# Patient Record
Sex: Female | Born: 2000 | Race: White | Hispanic: No | Marital: Married | Smoking: Current every day smoker
Health system: Southern US, Community
[De-identification: ages and names within clinical notes are randomized; demographics above are authoritative.]

## PROBLEM LIST (undated history)

## (undated) DIAGNOSIS — S0285XA Fracture of orbit, unspecified, initial encounter for closed fracture: Secondary | ICD-10-CM

## (undated) DIAGNOSIS — J039 Acute tonsillitis, unspecified: Secondary | ICD-10-CM

## (undated) DIAGNOSIS — F32A Depression, unspecified: Secondary | ICD-10-CM

## (undated) DIAGNOSIS — D513 Other dietary vitamin B12 deficiency anemia: Secondary | ICD-10-CM

## (undated) DIAGNOSIS — R002 Palpitations: Secondary | ICD-10-CM

## (undated) DIAGNOSIS — F419 Anxiety disorder, unspecified: Secondary | ICD-10-CM

## (undated) HISTORY — PX: NO PAST SURGERIES: SHX2092

## (undated) HISTORY — DX: Palpitations: R00.2

## (undated) HISTORY — DX: Depression, unspecified: F32.A

## (undated) HISTORY — DX: Anxiety disorder, unspecified: F41.9

## (undated) HISTORY — DX: Other dietary vitamin B12 deficiency anemia: D51.3

---
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
## 2019-02-25 ENCOUNTER — Emergency Department (HOSPITAL_BASED_OUTPATIENT_CLINIC_OR_DEPARTMENT_OTHER)
Admission: EM | Admit: 2019-02-25 | Discharge: 2019-02-26 | Disposition: A | Discharge status: Home / Self Care | Payer: Worker's Compensation | Attending: Emergency Medicine | Admitting: Emergency Medicine

## 2019-02-25 ENCOUNTER — Other Ambulatory Visit: Payer: Self-pay

## 2019-02-25 ENCOUNTER — Encounter (HOSPITAL_BASED_OUTPATIENT_CLINIC_OR_DEPARTMENT_OTHER): Payer: Self-pay

## 2019-02-25 ENCOUNTER — Emergency Department (HOSPITAL_BASED_OUTPATIENT_CLINIC_OR_DEPARTMENT_OTHER): Payer: Worker's Compensation

## 2019-02-25 DIAGNOSIS — Y939 Activity, unspecified: Secondary | ICD-10-CM | POA: Insufficient documentation

## 2019-02-25 DIAGNOSIS — W19XXXA Unspecified fall, initial encounter: Secondary | ICD-10-CM | POA: Insufficient documentation

## 2019-02-25 DIAGNOSIS — S63502A Unspecified sprain of left wrist, initial encounter: Secondary | ICD-10-CM | POA: Insufficient documentation

## 2019-02-25 DIAGNOSIS — S6992XA Unspecified injury of left wrist, hand and finger(s), initial encounter: Secondary | ICD-10-CM | POA: Diagnosis present

## 2019-02-25 DIAGNOSIS — Y990 Civilian activity done for income or pay: Secondary | ICD-10-CM | POA: Insufficient documentation

## 2019-02-25 DIAGNOSIS — Y9289 Other specified places as the place of occurrence of the external cause: Secondary | ICD-10-CM | POA: Insufficient documentation

## 2019-02-25 NOTE — ED Triage Notes (Signed)
Pt states she fel at work ~745pm-pain to left wrist-no break in skin/bruising or swelling noted-NAD-steady gait

## 2019-02-26 MED ORDER — NAPROXEN 250 MG PO TABS
500.0000 mg | ORAL_TABLET | Freq: Once | ORAL | Status: AC
  Administered 2019-02-26: 500 mg via ORAL
  Filled 2019-02-26: qty 2

## 2019-02-26 MED ORDER — NAPROXEN 375 MG PO TABS: ORAL_TABLET | ORAL | 0 refills | Status: DC

## 2019-02-26 NOTE — ED Provider Notes (Signed)
[facility] Provider Note: Lowella Dell, MD, FACEP  CSN: [id number] MRN: [id number] ARRIVAL: 02/25/19 at 2234 ROOM: MH05/MH05   CHIEF COMPLAINT  Wrist Injury   HISTORY OF PRESENT ILLNESS  02/26/19 12:54 AM Lori Matthews is a 18 y.o. female who fell at work yesterday evening about 7:45 PM.  She fell onto her outstretched left hand and has pain in her left wrist which she rates as a 9 out of 10, worse with movement.  She denies other injury.  There is no associated deformity or swelling.  The pain is located primarily dorsally.   Past Medical History:  Diagnosis Date  . Orbital fracture (HCC)   . Tonsillitis     Past Surgical History:  Procedure Laterality Date  . TONSILLECTOMY N/A 06/20/2018   Procedure: TONSILLECTOMY;  Surgeon: Drema Halon, MD;  Location: [facility];  Service: ENT;  Laterality: N/A;    Family History  Problem Relation Age of Onset  . Diabetes Mother   . Hypertension Mother   . Depression Maternal Grandmother   . Hypertension Maternal Grandmother   . Depression Maternal Grandfather   . Hypertension Maternal Grandfather     Social History   Tobacco Use  . Smoking status: Never Smoker  . Smokeless tobacco: Never Used  Substance Use Topics  . Alcohol use: No  . Drug use: No    Prior to Admission medications   Medication Sig Start Date End Date Taking? Authorizing Provider  aspirin-acetaminophen-caffeine (EXCEDRIN MIGRAINE) [phone] MG tablet Take by mouth every 6 (six) hours as needed for headache.    [provider]  naproxen (NAPROSYN) 375 MG tablet Take 1 tablet twice daily as needed for wrist pain. 02/26/19   Cornelious Bartolucci, MD  norelgestromin-ethinyl estradiol (ORTHO EVRA) 150-35 MCG/24HR transdermal patch Place 1 patch onto the skin once a week.    [provider]    Allergies Patient has no known allergies.   REVIEW OF SYSTEMS  Negative except as noted here or in the History of  Present Illness.   PHYSICAL EXAMINATION  Initial Vital Signs Blood pressure 133/84, pulse 88, temperature 99.2 F (37.3 C), temperature source Oral, resp. rate 16, height 5\' 7"  (1.702 m), weight 77.1 kg, SpO2 98 %.  Examination General: Well-developed, well-nourished female in no acute distress; appearance consistent with age of record HENT: normocephalic; atraumatic Eyes: pupils equal, round and reactive to light; extraocular muscles intact Neck: supple Heart: regular rate and rhythm Lungs: clear to auscultation bilaterally Abdomen: soft; nondistended; nontender; bowel sounds present Extremities: No deformity; full range of motion; pulses normal; mild tenderness of left wrist without snuffbox tenderness, no swelling or ecchymosis Neurologic: Awake, alert and oriented; motor function intact in all extremities and symmetric; no facial droop Skin: Warm and dry Psychiatric: Normal mood and affect   RESULTS  Summary of this visit's results, reviewed by myself:   EKG Interpretation  Date/Time:    Ventricular Rate:    PR Interval:    QRS Duration:   QT Interval:    QTC Calculation:   R Axis:     Text Interpretation:        Laboratory Studies: No results found for this or any previous visit (from the past 24 hour(s)). Imaging Studies: Dg Wrist Complete Left  Result Date: 02/25/2019 CLINICAL DATA:  18 year old female status post fall at work. EXAM: LEFT WRIST - COMPLETE 3+ VIEW COMPARISON:  Left forearm series 05/18/2012. FINDINGS: The patient is now skeletally immature. Bone mineralization  is within normal limits. There is no evidence of fracture or dislocation. There is no evidence of arthropathy or other focal bone abnormality. Soft tissues are unremarkable. IMPRESSION: Negative. Electronically Signed   By: Genevie Ann M.D.   On: 02/25/2019 23:21    ED COURSE and MDM  Nursing notes and initial vitals signs, including pulse oximetry, reviewed.  Vitals:   02/25/19 2243  BP:  133/84  Pulse: 88  Resp: 16  Temp: 99.2 F (37.3 C)  TempSrc: Oral  SpO2: 98%  Weight: 77.1 kg  Height: 5\' 7"  (1.702 m)   Examination and radiographs consistent with sprain of left wrist.  Will place in a Velcro splint and refer to sports medicine if symptoms persist.  PROCEDURES    ED DIAGNOSES     ICD-10-CM   1. Fall, initial encounter  W19.XXXA   2. Sprain of left wrist, initial encounter  Y30.160F        Shanon Rosser, MD 02/26/19 0105

## 2020-05-02 ENCOUNTER — Telehealth: Payer: Self-pay | Admitting: Physician Assistant

## 2020-05-02 ENCOUNTER — Telehealth: Payer: Self-pay | Admitting: Unknown Physician Specialty

## 2020-05-02 ENCOUNTER — Other Ambulatory Visit: Payer: Self-pay | Admitting: Unknown Physician Specialty

## 2020-05-02 DIAGNOSIS — U071 COVID-19: Secondary | ICD-10-CM

## 2020-05-02 DIAGNOSIS — E663 Overweight: Secondary | ICD-10-CM

## 2020-05-02 DIAGNOSIS — F17200 Nicotine dependence, unspecified, uncomplicated: Secondary | ICD-10-CM

## 2020-05-02 NOTE — Telephone Encounter (Signed)
Called to discuss with Antonietta Breach about Covid symptoms and the use of  monoclonal antibody infusion for those with mild to moderate Covid symptoms and at a high risk of hospitalization.     Pt is not qualified for this infusion due to lack of identified risk factors and co-morbid conditions.  Symptoms reviewed as well as criteria for ending isolation.  Symptoms reviewed that would warrant ED/Hospital evaluation as well should her condition worsen. Preventative practices reviewed. Patient verbalized understanding.  Manson Passey, PA-C

## 2020-05-02 NOTE — Telephone Encounter (Signed)
I connected by phone with Lori Matthews on 05/02/2020 at 3:46 PM to discuss the potential use of a new treatment for mild to moderate COVID-19 viral infection in non-hospitalized patients.  This patient is a 19 y.o. female that meets the FDA criteria for Emergency Use Authorization of COVID monoclonal antibody casirivimab/imdevimab, bamlanivimab/etesevimab, or sotrovimab.  Has a (+) direct SARS-CoV-2 viral test result  Has mild or moderate COVID-19   Is NOT hospitalized due to COVID-19  Is within 10 days of symptom onset  Has at least one of the high risk factor(s) for progression to severe COVID-19 and/or hospitalization as defined in EUA.  Specific high risk criteria : BMI > 25 smoker   I have spoken and communicated the following to the patient or parent/caregiver regarding COVID monoclonal antibody treatment:  1. FDA has authorized the emergency use for the treatment of mild to moderate COVID-19 in adults and pediatric patients with positive results of direct SARS-CoV-2 viral testing who are 64 years of age and older weighing at least 40 kg, and who are at high risk for progressing to severe COVID-19 and/or hospitalization.  2. The significant known and potential risks and benefits of COVID monoclonal antibody, and the extent to which such potential risks and benefits are unknown.  3. Information on available alternative treatments and the risks and benefits of those alternatives, including clinical trials.  4. Patients treated with COVID monoclonal antibody should continue to self-isolate and use infection control measures (e.g., wear mask, isolate, social distance, avoid sharing personal items, clean and disinfect "high touch" surfaces, and frequent handwashing) according to CDC guidelines.   5. The patient or parent/caregiver has the option to accept or refuse COVID monoclonal antibody treatment.  After reviewing this information with the patient, the patient has agreed to  receive one of the available covid 19 monoclonal antibodies and will be provided an appropriate fact sheet prior to infusion. Gabriel Cirri, NP 05/02/2020 3:46 PM  Sx onset 12/18

## 2020-05-03 ENCOUNTER — Ambulatory Visit (HOSPITAL_COMMUNITY): Payer: BLUE CROSS/BLUE SHIELD

## 2020-08-23 ENCOUNTER — Telehealth: Payer: Self-pay

## 2020-08-23 NOTE — Telephone Encounter (Signed)
Referral notes sent from [facility], Phone #: [phone], Fax #: [phone]   Notes sent to scheduling

## 2020-09-09 ENCOUNTER — Other Ambulatory Visit: Payer: Self-pay

## 2020-09-09 DIAGNOSIS — S0285XA Fracture of orbit, unspecified, initial encounter for closed fracture: Secondary | ICD-10-CM | POA: Insufficient documentation

## 2020-09-09 DIAGNOSIS — J039 Acute tonsillitis, unspecified: Secondary | ICD-10-CM | POA: Insufficient documentation

## 2020-09-09 DIAGNOSIS — F32A Depression, unspecified: Secondary | ICD-10-CM | POA: Insufficient documentation

## 2020-09-09 DIAGNOSIS — F419 Anxiety disorder, unspecified: Secondary | ICD-10-CM | POA: Insufficient documentation

## 2020-09-09 DIAGNOSIS — R002 Palpitations: Secondary | ICD-10-CM | POA: Insufficient documentation

## 2020-09-09 DIAGNOSIS — D513 Other dietary vitamin B12 deficiency anemia: Secondary | ICD-10-CM | POA: Insufficient documentation

## 2020-09-16 ENCOUNTER — Encounter: Payer: Self-pay | Admitting: Cardiology

## 2020-09-16 ENCOUNTER — Telehealth (INDEPENDENT_AMBULATORY_CARE_PROVIDER_SITE_OTHER): Payer: BLUE CROSS/BLUE SHIELD | Admitting: Cardiology

## 2020-09-16 VITALS — BP 110/67 | HR 72 | Ht 67.0 in | Wt 161.0 lb

## 2020-09-16 DIAGNOSIS — F419 Anxiety disorder, unspecified: Secondary | ICD-10-CM

## 2020-09-16 DIAGNOSIS — R002 Palpitations: Secondary | ICD-10-CM
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
## 2020-09-16 NOTE — Patient Instructions (Signed)
Medication Instructions:  Your physician recommends that you continue on your current medications as directed. Please refer to the Current Medication list given to you today.  *If you need a refill on your cardiac medications before your next appointment, please call your pharmacy*   Lab Work: None If you have labs (blood work) drawn today and your tests are completely normal, you will receive your results only by: Marland Kitchen MyChart Message (if you have MyChart) OR . A paper copy in the mail If you have any lab test that is abnormal or we need to change your treatment, we will call you to review the results.   Testing/Procedures: Your physician has requested that you have an echocardiogram. Echocardiography is a painless test that uses sound waves to create images of your heart. It provides your doctor with information about the size and shape of your heart and how well your heart's chambers and valves are working. This procedure takes approximately one hour. There are no restrictions for this procedure.     Follow-Up: At [facility], you and your health needs are our priority.  As part of our continuing mission to provide you with exceptional heart care, we have created designated Provider Care Teams.  These Care Teams include your primary Cardiologist (physician) and Advanced Practice Providers (APPs -  Physician Assistants and Nurse Practitioners) who all work together to provide you with the care you need, when you need it.  We recommend signing up for the patient portal called "MyChart".  Sign up information is provided on this After Visit Summary.  MyChart is used to connect with patients for Virtual Visits (Telemedicine).  Patients are able to view lab/test results, encounter notes, upcoming appointments, etc.  Non-urgent messages can be sent to your provider as well.   To learn more about what you can do with MyChart, go to [email or web address].    Your next appointment:   2  month(s)  The format for your next appointment:   In Person  Provider:   Belva Crome, MD   Other Instructions

## 2020-10-14 ENCOUNTER — Ambulatory Visit (INDEPENDENT_AMBULATORY_CARE_PROVIDER_SITE_OTHER): Payer: No Typology Code available for payment source

## 2020-10-14 ENCOUNTER — Other Ambulatory Visit: Payer: Self-pay

## 2020-10-14 DIAGNOSIS — F419 Anxiety disorder, unspecified: Secondary | ICD-10-CM

## 2020-10-14 DIAGNOSIS — R002 Palpitations: Secondary | ICD-10-CM

## 2020-10-14 LAB — ECHOCARDIOGRAM COMPLETE
Area-P 1/2: 3.06 cm2
S' Lateral: 3.6 cm

## 2020-10-14 NOTE — Progress Notes (Signed)
Complete echocardiogram performed.  Jimmy Ciana Simmon RDCS, RVT  

## 2020-10-30 ENCOUNTER — Other Ambulatory Visit: Payer: Self-pay

## 2020-10-30 ENCOUNTER — Ambulatory Visit
Admission: EM | Admit: 2020-10-30 | Discharge: 2020-10-30 | Disposition: A | Discharge status: Home / Self Care | Payer: No Typology Code available for payment source | Attending: Emergency Medicine | Admitting: Emergency Medicine

## 2020-10-30 ENCOUNTER — Encounter: Payer: Self-pay | Admitting: Emergency Medicine

## 2020-10-30 DIAGNOSIS — Z20822 Contact with and (suspected) exposure to covid-19: Secondary | ICD-10-CM | POA: Diagnosis not present

## 2020-10-30 DIAGNOSIS — H66002 Acute suppurative otitis media without spontaneous rupture of ear drum, left ear: Secondary | ICD-10-CM | POA: Insufficient documentation

## 2020-10-30 DIAGNOSIS — Z28310 Unvaccinated for covid-19: Secondary | ICD-10-CM | POA: Insufficient documentation

## 2020-10-30 DIAGNOSIS — R059 Cough, unspecified: Secondary | ICD-10-CM | POA: Insufficient documentation

## 2020-10-30 DIAGNOSIS — H6123 Impacted cerumen, bilateral: Secondary | ICD-10-CM

## 2020-10-30 DIAGNOSIS — F17290 Nicotine dependence, other tobacco product, uncomplicated: Secondary | ICD-10-CM | POA: Insufficient documentation

## 2020-10-30 DIAGNOSIS — J069 Acute upper respiratory infection, unspecified: Secondary | ICD-10-CM

## 2020-10-30 DIAGNOSIS — R197 Diarrhea, unspecified: Secondary | ICD-10-CM | POA: Diagnosis not present

## 2020-10-30 DIAGNOSIS — J029 Acute pharyngitis, unspecified: Secondary | ICD-10-CM | POA: Diagnosis not present

## 2020-10-30 LAB — POCT RAPID STREP A: Streptococcus, Group A Screen (Direct): NEGATIVE

## 2020-10-30 MED ORDER — PROMETHAZINE-DM 6.25-15 MG/5ML PO SYRP: 5.0000 mL | ORAL_SOLUTION | Freq: Four times a day (QID) | ORAL | 0 refills | Status: AC | PRN

## 2020-10-30 MED ORDER — AMOXICILLIN-POT CLAVULANATE 400-57 MG/5ML PO SUSR: 875.0000 mg | Freq: Two times a day (BID) | ORAL | 0 refills | Status: AC

## 2020-10-30 MED ORDER — BENZONATATE 100 MG PO CAPS: 200.0000 mg | ORAL_CAPSULE | Freq: Three times a day (TID) | ORAL | 0 refills | Status: DC

## 2020-10-30 NOTE — Discharge Instructions (Addendum)
Isolate at home pending the results of your COVID test.  If you test positive then you will have to quarantine for 5 days from the start of your symptoms.  After 5 days you can break quarantine if your symptoms have improved and you have not had a fever for 24 hours without taking Tylenol or ibuprofen.  Use over-the-counter Tylenol and ibuprofen as needed for body aches and fever.  Use the Tessalon Perles during the day as needed for cough and the Promethazine DM cough syrup at nighttime as will make you drowsy.  If you develop any increased shortness of breath-especially at rest, you are unable to speak in full sentences, or is a late sign your lips are turning blue you need to go the ER for evaluation.   For the infection take Augmentin twice daily for 10 days.  Take this with food.  You still have wax in your right ear canal.  You can continue to flush out your ear at home by either submerging her head in a bathtub and agitating her head back-and-forth to create a way of action or by turning her ear up to the faucet in the shower.
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
## 2020-10-30 NOTE — ED Triage Notes (Signed)
Patient c/o sore throat, headache and bodyaches that started Saturday morning.  Patient reports dry cough.  Patient denies fevers.

## 2020-10-31 LAB — SARS CORONAVIRUS 2 (TAT 6-24 HRS): SARS Coronavirus 2: NEGATIVE

## 2020-11-02 LAB — CULTURE, GROUP A STREP (THRC)

## 2020-12-02 ENCOUNTER — Other Ambulatory Visit: Payer: Self-pay

## 2020-12-02 ENCOUNTER — Ambulatory Visit (INDEPENDENT_AMBULATORY_CARE_PROVIDER_SITE_OTHER): Payer: No Typology Code available for payment source | Admitting: Cardiology

## 2020-12-02 ENCOUNTER — Encounter: Payer: Self-pay | Admitting: Cardiology

## 2020-12-02 VITALS — BP 98/56 | HR 56 | Ht 67.5 in | Wt 176.0 lb

## 2020-12-02 DIAGNOSIS — Z8616 Personal history of COVID-19: Secondary | ICD-10-CM

## 2020-12-02 DIAGNOSIS — R0600 Dyspnea, unspecified: Secondary | ICD-10-CM

## 2020-12-02 DIAGNOSIS — R0609 Other forms of dyspnea: Secondary | ICD-10-CM | POA: Insufficient documentation

## 2020-12-02 DIAGNOSIS — R002 Palpitations: Secondary | ICD-10-CM | POA: Diagnosis not present
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
## 2020-12-02 NOTE — Patient Instructions (Addendum)
Medication Instructions:  Your physician recommends that you continue on your current medications as directed. Please refer to the Current Medication list given to you today.  *If you need a refill on your cardiac medications before your next appointment, please call your pharmacy*   Lab Work: None  If you have labs (blood work) drawn today and your tests are completely normal, you will receive your results only by: MyChart Message (if you have MyChart) OR A paper copy in the mail If you have any lab test that is abnormal or we need to change your treatment, we will call you to review the results.   Testing/Procedures: None    Follow-Up: At [facility], you and your health needs are our priority.  As part of our continuing mission to provide you with exceptional heart care, we have created designated Provider Care Teams.  These Care Teams include your primary Cardiologist (physician) and Advanced Practice Providers (APPs -  Physician Assistants and Nurse Practitioners) who all work together to provide you with the care you need, when you need it.  We recommend signing up for the patient portal called "MyChart".  Sign up information is provided on this After Visit Summary.  MyChart is used to connect with patients for Virtual Visits (Telemedicine).  Patients are able to view lab/test results, encounter notes, upcoming appointments, etc.  Non-urgent messages can be sent to your provider as well.   To learn more about what you can do with MyChart, go to [email or web address].    Your next appointment:   As needed     The format for your next appointment:   In Person  Provider:   Robert Krasowski, MD   Other Instructions   

## 2020-12-15 ENCOUNTER — Ambulatory Visit: Payer: No Typology Code available for payment source

## 2020-12-16 ENCOUNTER — Encounter: Payer: Self-pay | Admitting: Emergency Medicine

## 2020-12-16 ENCOUNTER — Emergency Department: Payer: No Typology Code available for payment source

## 2020-12-16 ENCOUNTER — Other Ambulatory Visit: Payer: Self-pay

## 2020-12-16 ENCOUNTER — Emergency Department
Admission: EM | Admit: 2020-12-16 | Discharge: 2020-12-16 | Disposition: A | Discharge status: Home / Self Care | Payer: No Typology Code available for payment source | Attending: Emergency Medicine | Admitting: Emergency Medicine

## 2020-12-16 DIAGNOSIS — W228XXA Striking against or struck by other objects, initial encounter: Secondary | ICD-10-CM | POA: Diagnosis not present

## 2020-12-16 DIAGNOSIS — F17290 Nicotine dependence, other tobacco product, uncomplicated: Secondary | ICD-10-CM | POA: Insufficient documentation

## 2020-12-16 DIAGNOSIS — Z8616 Personal history of COVID-19: Secondary | ICD-10-CM | POA: Insufficient documentation

## 2020-12-16 DIAGNOSIS — S60221A Contusion of right hand, initial encounter: Secondary | ICD-10-CM | POA: Insufficient documentation

## 2020-12-16 DIAGNOSIS — S4991XA Unspecified injury of right shoulder and upper arm, initial encounter: Secondary | ICD-10-CM | POA: Diagnosis present

## 2020-12-16 MED ORDER — NAPROXEN 500 MG PO TABS
500.0000 mg | ORAL_TABLET | Freq: Once | ORAL | Status: AC
  Administered 2020-12-16: 500 mg via ORAL
  Filled 2020-12-16: qty 1

## 2020-12-16 MED ORDER — NAPROXEN 500 MG PO TABS: 500.0000 mg | ORAL_TABLET | Freq: Two times a day (BID) | ORAL | 0 refills | Status: DC

## 2020-12-16 MED ORDER — NAPROXEN 500 MG PO TABS: 500.0000 mg | ORAL_TABLET | Freq: Two times a day (BID) | ORAL | 0 refills | Status: AC

## 2020-12-16 NOTE — ED Triage Notes (Signed)
Pt reports her dog ran into her full speed and hurt her right hand. Pt reports painful to move fingers and base of thumb is the most painful.

## 2020-12-16 NOTE — ED Notes (Signed)
Ace wrap applied to right hand per patient's request and with PA-C's approval.
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
## 2020-12-16 NOTE — Discharge Instructions (Addendum)
Please rest ice and elevate the right hand.  Work on gentle finger range of motion.  Take naproxen 500 mg twice daily for 1 week as needed for pain.  Call orthopedic office in 1 week if no improvement.

## 2021-04-25 ENCOUNTER — Emergency Department: Payer: No Typology Code available for payment source

## 2021-04-25 ENCOUNTER — Emergency Department
Admission: EM | Admit: 2021-04-25 | Discharge: 2021-04-26 | Disposition: A | Discharge status: Home / Self Care | Payer: No Typology Code available for payment source | Attending: Emergency Medicine | Admitting: Emergency Medicine

## 2021-04-25 ENCOUNTER — Other Ambulatory Visit: Payer: Self-pay

## 2021-04-25 ENCOUNTER — Encounter: Payer: Self-pay | Admitting: Emergency Medicine

## 2021-04-25 DIAGNOSIS — Z8616 Personal history of COVID-19: Secondary | ICD-10-CM | POA: Diagnosis not present

## 2021-04-25 DIAGNOSIS — B349 Viral infection, unspecified: Secondary | ICD-10-CM | POA: Diagnosis not present

## 2021-04-25 DIAGNOSIS — F17290 Nicotine dependence, other tobacco product, uncomplicated: Secondary | ICD-10-CM | POA: Diagnosis not present

## 2021-04-25 DIAGNOSIS — Z20822 Contact with and (suspected) exposure to covid-19: Secondary | ICD-10-CM | POA: Insufficient documentation

## 2021-04-25 DIAGNOSIS — R509 Fever, unspecified: Secondary | ICD-10-CM | POA: Diagnosis present

## 2021-04-25 LAB — CBC
HCT: 37.2 % (ref 36.0–46.0)
Hemoglobin: 12 g/dL (ref 12.0–15.0)
MCH: 28.7 pg (ref 26.0–34.0)
MCHC: 32.3 g/dL (ref 30.0–36.0)
MCV: 89 fL (ref 80.0–100.0)
Platelets: 229 10*3/uL (ref 150–400)
RBC: 4.18 MIL/uL (ref 3.87–5.11)
RDW: 13.2 % (ref 11.5–15.5)
WBC: 6.2 10*3/uL (ref 4.0–10.5)
nRBC: 0 % (ref 0.0–0.2)

## 2021-04-25 LAB — BASIC METABOLIC PANEL
Anion gap: 8 (ref 5–15)
BUN: 14 mg/dL (ref 6–20)
CO2: 23 mmol/L (ref 22–32)
Calcium: 8.9 mg/dL (ref 8.9–10.3)
Chloride: 106 mmol/L (ref 98–111)
Creatinine, Ser: 0.87 mg/dL (ref 0.44–1.00)
GFR, Estimated: >60 mL/min (ref >60)
Glucose, Bld: 98 mg/dL (ref 70–99)
Potassium: 3.5 mmol/L (ref 3.5–5.1)
Sodium: 137 mmol/L (ref 135–145)

## 2021-04-25 LAB — RESP PANEL BY RT-PCR (FLU A&B, COVID) ARPGX2
Influenza A by PCR: NEGATIVE
Influenza B by PCR: NEGATIVE
SARS Coronavirus 2 by RT PCR: NEGATIVE

## 2021-04-25 LAB — TROPONIN I (HIGH SENSITIVITY): Troponin I (High Sensitivity): <2 ng/L (ref <18)

## 2021-04-25 MED ORDER — PREDNISONE 50 MG PO TABS: 50.0000 mg | ORAL_TABLET | Freq: Every day | ORAL | 0 refills | Status: AC

## 2021-04-25 MED ORDER — ALBUTEROL SULFATE HFA 108 (90 BASE) MCG/ACT IN AERS: 2.0000 | INHALATION_SPRAY | RESPIRATORY_TRACT | 0 refills | Status: AC | PRN

## 2021-04-25 MED ORDER — FLUTICASONE PROPIONATE 50 MCG/ACT NA SUSP: 1.0000 | Freq: Two times a day (BID) | NASAL | 0 refills | Status: AC

## 2021-04-25 MED ORDER — MAGIC MOUTHWASH W/LIDOCAINE: 5.0000 mL | Freq: Four times a day (QID) | ORAL | 0 refills | Status: DC

## 2021-04-25 MED ORDER — BENZONATATE 100 MG PO CAPS: 100.0000 mg | ORAL_CAPSULE | Freq: Three times a day (TID) | ORAL | 0 refills | Status: DC | PRN

## 2021-04-25 MED ORDER — ACETAMINOPHEN 325 MG PO TABS
650.0000 mg | ORAL_TABLET | Freq: Once | ORAL | Status: AC
  Administered 2021-04-25: 650 mg via ORAL
  Filled 2021-04-25: qty 2
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
## 2021-04-25 NOTE — ED Triage Notes (Signed)
Pt reports that she has had a sore throat, cough, bodyaches since Thursday, but the last few days she has had SHOB and CP. She took a COVID test at home yesterday and it was negative.

## 2021-08-07 ENCOUNTER — Ambulatory Visit (LOCAL_COMMUNITY_HEALTH_CENTER): Payer: Self-pay

## 2021-08-07 ENCOUNTER — Other Ambulatory Visit: Payer: Self-pay

## 2021-08-07 DIAGNOSIS — Z0184 Encounter for antibody response examination: Secondary | ICD-10-CM

## 2021-08-07 NOTE — Progress Notes (Signed)
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 Dental office paid for titer today. Per [facility] review, pt has had total of 5 Hep B vaccines. Per pt, she had low Hep B titer 2021 and received 2 doses of Hep B vaccine.  ? ?Pt plans to view Hep B titer results on MyChart and is aware that [facility] RN will call with results and a copy can be made available for pick up at [facility] if needed. ? ?ROI signed and RN walked pt to lab. Jerel Shepherd, RN ? ?

## 2021-08-08 LAB — HEPATITIS B SURFACE ANTIBODY, QUANTITATIVE: Hepatitis B Surf Ab Quant: >1000 m[IU]/mL (ref >9.9)

## 2021-08-09 ENCOUNTER — Telehealth: Payer: Self-pay | Admitting: Family Medicine

## 2021-08-09 NOTE — Telephone Encounter (Signed)
Pt states that she missed a phone call and believes the call may have been about her titer results. Please call.  ?

## 2021-08-10 NOTE — Telephone Encounter (Signed)
Return call to patient regarding her HEP B Titer results.  Patient is Immune.  Left message to return my call at [phone] or check her MyChart message. Dahlia Bailiff, RN ? ?

## 2021-08-10 NOTE — Telephone Encounter (Signed)
Patient has viewed her Hep B Titer results via MyChart message sent this morning.  Last patient login 08-10-2021. Hart Carwin, RN ? ?

## 2021-12-13 ENCOUNTER — Other Ambulatory Visit: Payer: Self-pay

## 2021-12-13 ENCOUNTER — Emergency Department
Admission: EM | Admit: 2021-12-13 | Discharge: 2021-12-13 | Disposition: A | Discharge status: Home / Self Care | Payer: No Typology Code available for payment source | Attending: Emergency Medicine | Admitting: Emergency Medicine

## 2021-12-13 ENCOUNTER — Emergency Department: Payer: No Typology Code available for payment source

## 2021-12-13 DIAGNOSIS — Y92410 Unspecified street and highway as the place of occurrence of the external cause: Secondary | ICD-10-CM | POA: Insufficient documentation

## 2021-12-13 DIAGNOSIS — S060X0A Concussion without loss of consciousness, initial encounter: Secondary | ICD-10-CM

## 2021-12-13 DIAGNOSIS — M25562 Pain in left knee: Secondary | ICD-10-CM

## 2021-12-13 DIAGNOSIS — S0990XA Unspecified injury of head, initial encounter: Secondary | ICD-10-CM | POA: Diagnosis present

## 2021-12-13 DIAGNOSIS — S161XXA Strain of muscle, fascia and tendon at neck level, initial encounter: Secondary | ICD-10-CM

## 2021-12-13 MED ORDER — BACLOFEN 10 MG PO TABS: 10.0000 mg | ORAL_TABLET | Freq: Three times a day (TID) | ORAL | 0 refills | Status: AC
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
## 2021-12-13 NOTE — Discharge Instructions (Signed)
Follow-up with your regular doctor if not improving in 3 days.  Return if worsening. ?
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
## 2022-03-27 ENCOUNTER — Ambulatory Visit (INDEPENDENT_AMBULATORY_CARE_PROVIDER_SITE_OTHER): Payer: No Typology Code available for payment source

## 2022-03-27 ENCOUNTER — Ambulatory Visit
Admission: RE | Admit: 2022-03-27 | Discharge: 2022-03-27 | Disposition: A | Discharge status: Home / Self Care | Payer: No Typology Code available for payment source | Source: Ambulatory Visit | Attending: Emergency Medicine | Admitting: Emergency Medicine

## 2022-03-27 VITALS — BP 113/71 | HR 82 | Temp 97.9°F | Resp 18 | Ht 67.0 in | Wt 180.0 lb

## 2022-03-27 DIAGNOSIS — S8002XA Contusion of left knee, initial encounter: Secondary | ICD-10-CM | POA: Diagnosis not present

## 2022-03-27 DIAGNOSIS — M25562 Pain in left knee: Secondary | ICD-10-CM

## 2022-03-27 DIAGNOSIS — W19XXXA Unspecified fall, initial encounter: Secondary | ICD-10-CM | POA: Diagnosis not present

## 2022-03-27 DIAGNOSIS — S8992XA Unspecified injury of left lower leg, initial encounter: Secondary | ICD-10-CM

## 2022-03-27 DIAGNOSIS — S8990XA Unspecified injury of unspecified lower leg, initial encounter: Secondary | ICD-10-CM | POA: Insufficient documentation

## 2022-03-27 DIAGNOSIS — M25462 Effusion, left knee: Secondary | ICD-10-CM

## 2022-03-27 NOTE — Discharge Instructions (Addendum)
Take Tylenol or ibuprofen as directed.  Rest and elevate your knee.  Apply ice packs 2-3 times a day for up to 20 minutes each.  Wear the knee sleeve.  Follow up with an orthopedist such as the one listed below.
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
## 2022-03-27 NOTE — ED Triage Notes (Signed)
Patient to Urgent Care with complaints of left sided knee pain. Reports falling (reports slipping and falling on hardwood floor) on Friday 11/10 and is having continued discomfort. 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 Denies hx of injury.

## 2022-04-03 ENCOUNTER — Other Ambulatory Visit: Payer: Self-pay | Admitting: Orthopedic Surgery

## 2022-04-03 DIAGNOSIS — S83412A Sprain of medial collateral ligament of left knee, initial encounter: Secondary | ICD-10-CM

## 2022-04-10 ENCOUNTER — Ambulatory Visit
Admission: RE | Admit: 2022-04-10 | Discharge: 2022-04-10 | Disposition: A | Discharge status: Home / Self Care | Payer: No Typology Code available for payment source | Source: Ambulatory Visit | Attending: Orthopedic Surgery | Admitting: Orthopedic Surgery

## 2022-04-10 DIAGNOSIS — S83412A Sprain of medial collateral ligament of left knee, initial encounter: Secondary | ICD-10-CM | POA: Insufficient documentation

## 2022-04-18 ENCOUNTER — Emergency Department: Payer: No Typology Code available for payment source

## 2022-04-18 ENCOUNTER — Other Ambulatory Visit: Payer: Self-pay

## 2022-04-18 ENCOUNTER — Emergency Department
Admission: EM | Admit: 2022-04-18 | Discharge: 2022-04-18 | Disposition: A | Discharge status: Home / Self Care | Payer: No Typology Code available for payment source | Attending: Emergency Medicine | Admitting: Emergency Medicine

## 2022-04-18 DIAGNOSIS — K5900 Constipation, unspecified: Secondary | ICD-10-CM | POA: Diagnosis not present

## 2022-04-18 DIAGNOSIS — Z1152 Encounter for screening for COVID-19: Secondary | ICD-10-CM | POA: Diagnosis not present

## 2022-04-18 DIAGNOSIS — D72819 Decreased white blood cell count, unspecified: Secondary | ICD-10-CM | POA: Insufficient documentation

## 2022-04-18 DIAGNOSIS — R1084 Generalized abdominal pain: Secondary | ICD-10-CM | POA: Diagnosis present

## 2022-04-18 DIAGNOSIS — R109 Unspecified abdominal pain: Secondary | ICD-10-CM

## 2022-04-18 LAB — LIPASE, BLOOD: Lipase: 40 U/L (ref 11–51)

## 2022-04-18 LAB — URINALYSIS, ROUTINE W REFLEX MICROSCOPIC
Bacteria, UA: NONE SEEN
Bilirubin Urine: NEGATIVE
Glucose, UA: NEGATIVE mg/dL
Ketones, ur: NEGATIVE mg/dL
Leukocytes,Ua: NEGATIVE
Nitrite: NEGATIVE
Protein, ur: NEGATIVE mg/dL
Specific Gravity, Urine: 1.014 (ref 1.005–1.030)
pH: 8 (ref 5.0–8.0)

## 2022-04-18 LAB — COMPREHENSIVE METABOLIC PANEL
ALT: 11 U/L (ref 0–44)
AST: 21 U/L (ref 15–41)
Albumin: 4.3 g/dL (ref 3.5–5.0)
Alkaline Phosphatase: 65 U/L (ref 38–126)
Anion gap: 5 (ref 5–15)
BUN: 11 mg/dL (ref 6–20)
CO2: 23 mmol/L (ref 22–32)
Calcium: 9.3 mg/dL (ref 8.9–10.3)
Chloride: 110 mmol/L (ref 98–111)
Creatinine, Ser: 0.81 mg/dL (ref 0.44–1.00)
GFR, Estimated: >60 mL/min (ref >60)
Glucose, Bld: 111 mg/dL — ABNORMAL HIGH (ref 70–99)
Potassium: 3.6 mmol/L (ref 3.5–5.1)
Sodium: 138 mmol/L (ref 135–145)
Total Bilirubin: 1 mg/dL (ref 0.3–1.2)
Total Protein: 7.5 g/dL (ref 6.5–8.1)

## 2022-04-18 LAB — RESP PANEL BY RT-PCR (FLU A&B, COVID) ARPGX2
Influenza A by PCR: NEGATIVE
Influenza B by PCR: NEGATIVE
SARS Coronavirus 2 by RT PCR: NEGATIVE

## 2022-04-18 LAB — CBC
HCT: 40.1 % (ref 36.0–46.0)
Hemoglobin: 12.7 g/dL (ref 12.0–15.0)
MCH: 27.9 pg (ref 26.0–34.0)
MCHC: 31.7 g/dL (ref 30.0–36.0)
MCV: 87.9 fL (ref 80.0–100.0)
Platelets: 233 10*3/uL (ref 150–400)
RBC: 4.56 MIL/uL (ref 3.87–5.11)
RDW: 12.8 % (ref 11.5–15.5)
WBC: 11 10*3/uL — ABNORMAL HIGH (ref 4.0–10.5)
nRBC: 0 % (ref 0.0–0.2)

## 2022-04-18 LAB — PREGNANCY, URINE: Preg Test, Ur: NEGATIVE

## 2022-04-18 MED ORDER — POLYETHYLENE GLYCOL 3350 17 GM/SCOOP PO POWD: 17.0000 g | Freq: Every day | ORAL | 0 refills | Status: AC

## 2022-04-18 MED ORDER — ONDANSETRON HCL 4 MG/2ML IJ SOLN
4.0000 mg | Freq: Once | INTRAMUSCULAR | Status: AC
  Administered 2022-04-18: 4 mg via INTRAVENOUS
  Filled 2022-04-18: qty 2

## 2022-04-18 MED ORDER — SODIUM CHLORIDE 0.9 % IV BOLUS
1000.0000 mL | Freq: Once | INTRAVENOUS | Status: AC
  Administered 2022-04-18: 1000 mL via INTRAVENOUS

## 2022-04-18 MED ORDER — DICYCLOMINE HCL 10 MG PO CAPS: 10.0000 mg | ORAL_CAPSULE | Freq: Three times a day (TID) | ORAL | 0 refills | Status: AC | PRN

## 2022-04-18 MED ORDER — MORPHINE SULFATE (PF) 4 MG/ML IV SOLN
4.0000 mg | Freq: Once | INTRAVENOUS | Status: AC
  Administered 2022-04-18: 4 mg via INTRAVENOUS
  Filled 2022-04-18: qty 1

## 2022-04-18 MED ORDER — IOHEXOL 300 MG/ML  SOLN
100.0000 mL | Freq: Once | INTRAMUSCULAR | Status: AC | PRN
  Administered 2022-04-18: 100 mL via INTRAVENOUS

## 2022-04-18 MED ORDER — DOCUSATE SODIUM 100 MG PO CAPS: 100.0000 mg | ORAL_CAPSULE | Freq: Two times a day (BID) | ORAL | 2 refills | Status: AC

## 2022-04-18 NOTE — ED Provider Notes (Signed)
[facility] Provider Note    Event Date/Time   First MD Initiated Contact with Patient 04/18/22 [phone]     (approximate)  History   Chief Complaint: Abdominal Pain  HPI  Lori Matthews is a 21 y.o. female with a past medical history of anemia, anxiety, palpitations, presents emergency department for diffuse abdominal pain.  According to the patient states she woke up overnight with diffuse abdominal pain.  States the pain is a 10/10 diffusely throughout her abdomen but mostly on her bilateral flanks right and left-sided.  Denies any nausea vomiting diarrhea.  Denies any dysuria.  Denies any vaginal discharge but is currently on her menstrual cycle.  Physical Exam   Triage Vital Signs: ED Triage Vitals  Enc Vitals Group     BP 04/18/22 0722 121/79     Pulse Rate 04/18/22 0722 (!) 114     Resp 04/18/22 0722 20     Temp 04/18/22 0722 98.4 F (36.9 C)     Temp Source 04/18/22 0722 Oral     SpO2 04/18/22 0722 100 %     Weight 04/18/22 0723 185 lb (83.9 kg)     Height 04/18/22 0723 5\' 7"  (1.702 m)     Head Circumference --      Peak Flow --      Pain Score 04/18/22 0723 10     Pain Loc --      Pain Edu? --      Excl. in [place]? --     Most recent vital signs: Vitals:   04/18/22 0722  BP: 121/79  Pulse: (!) 114  Resp: 20  Temp: 98.4 F (36.9 C)  SpO2: 100%    General: Awake, no distress.  CV:  Good peripheral perfusion.  Regular rate and rhythm  Resp:  Normal effort.  Equal breath sounds bilaterally.  Abd:  No distention.  Soft, moderate diffuse tenderness to palpation throughout the abdomen with no area of focal tenderness identified.  No rebound or guarding.   ED Results / Procedures / Treatments   RADIOLOGY  I have reviewed and interpreted the CT as I do not see any obvious abnormality besides what appears to be moderate constipation. Radiology has read the CT scan is negative for acute abnormality.   MEDICATIONS ORDERED IN  ED: Medications  morphine (PF) 4 MG/ML injection 4 mg (has no administration in time range)  ondansetron (ZOFRAN) injection 4 mg (has no administration in time range)  sodium chloride 0.9 % bolus 1,000 mL (has no administration in time range)     IMPRESSION / MDM / ASSESSMENT AND PLAN / ED COURSE  I reviewed the triage vital signs and the nursing notes.  Patient's presentation is most consistent with acute presentation with potential threat to life or bodily function.  Patient presents to the emergency department for diffuse abdominal pain that awoke her from her sleep overnight.  States her pain is currently a 10/10 diffusely throughout her abdomen but seems to be worse in her bilateral flanks.  No urinary symptoms, no vaginal discharge.  Currently on her menstrual cycle.  Patient denies any fever, nausea vomiting diarrhea.  Patient's lab work has resulted showing a slight leukocytosis of 11,000 otherwise reassuring CBC, normal chemistry including LFTs and renal function.  Negative lipase.  Nonconcerning urinalysis.  We will treat the patient's pain and nausea IV hydrate.  We will obtain a CT scan given her diffuse complaint of abdominal pain.  Patient agreeable to plan of care  and work-up.  CT negative for acute abnormality.  Lab work largely nonrevealing as well.  Chemistry is normal, lipase normal pregnancy test negative, urinalysis shows no concerning finding.  CBC shows slight leukocytosis of 11,000.  CT scan on my review appears to show moderate constipation.  We will place patient on Colace and MiraLAX have patient follow-up with her doctor.  Patient states a longstanding history of constipation and abdominal pain since her childhood.  FINAL CLINICAL IMPRESSION(S) / ED DIAGNOSES   Abdominal pain  Note:  This document was prepared using Dragon voice recognition software and may include unintentional dictation errors.   Minna Antis, MD 04/18/22 1045

## 2022-04-18 NOTE — ED Triage Notes (Signed)
Pt states coming in due to a sore throat that started last night and bilateral flank pain. Pt states her back was hearting earlier. Pt denies nausea, vomiting, diarrhea or any trauma. Pt denies burning with urination

## 2022-12-12 ENCOUNTER — Ambulatory Visit
Admission: EM | Admit: 2022-12-12 | Discharge: 2022-12-12 | Disposition: A | Discharge status: Home / Self Care | Payer: No Typology Code available for payment source | Attending: Family Medicine | Admitting: Family Medicine

## 2022-12-12 DIAGNOSIS — Z23 Encounter for immunization: Secondary | ICD-10-CM | POA: Diagnosis not present

## 2022-12-12 DIAGNOSIS — T23251A Burn of second degree of right palm, initial encounter: Secondary | ICD-10-CM

## 2022-12-12 MED ORDER — TETANUS-DIPHTH-ACELL PERTUSSIS 5-2.5-18.5 LF-MCG/0.5 IM SUSY
0.5000 mL | PREFILLED_SYRINGE | Freq: Once | INTRAMUSCULAR | Status: AC
  Administered 2022-12-12: 0.5 mL via INTRAMUSCULAR

## 2022-12-12 MED ORDER — TRAMADOL HCL 50 MG PO TABS: 50.0000 mg | ORAL_TABLET | Freq: Four times a day (QID) | ORAL | 0 refills | Status: AC | PRN

## 2022-12-12 MED ORDER — SILVER SULFADIAZINE 1 % EX CREA: 1.0000 | TOPICAL_CREAM | Freq: Two times a day (BID) | CUTANEOUS | 0 refills | Status: AC

## 2022-12-12 NOTE — Discharge Instructions (Addendum)
See handout on burn care. Stop by the pharmacy to pick up you burn treatment cream.     Call the [facility] for follow up Address: [address] [place], [place] [address] Phone: [phone]
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
## 2023-03-12 IMAGING — CR DG CHEST 2V
1 series · 2 of 2 positions shown · non-contrast
Comparison: None

CLINICAL DATA: Chest pain, shortness of breath, sore throat, cough,
and body aches since [REDACTED], negative [id number] test at home
yesterday

EXAM:
CHEST - 2 VIEW

[Series 1: w chest pa · 0.14mm/px · 2 of 2 slices shown]
[im 1/2]
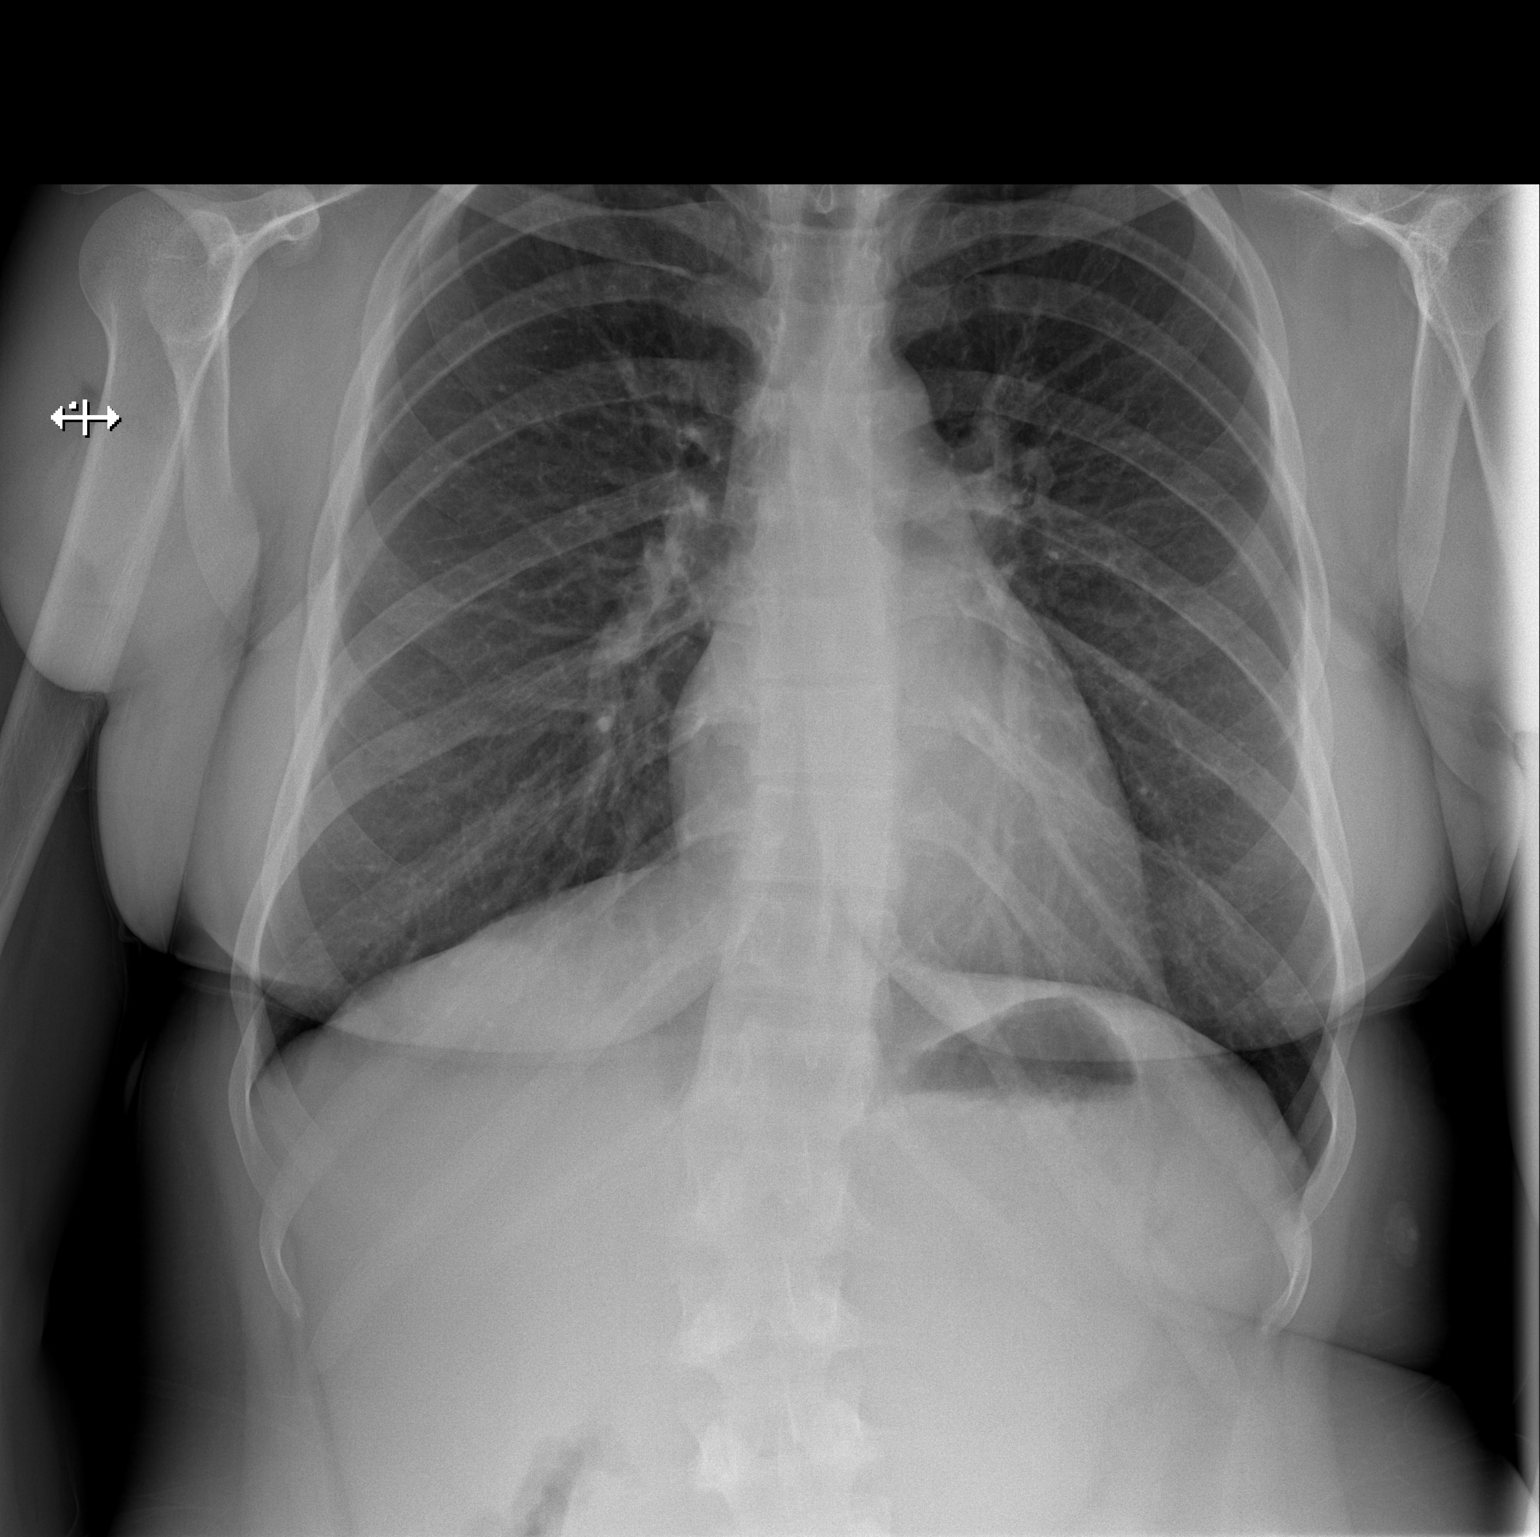
[im 2/2]
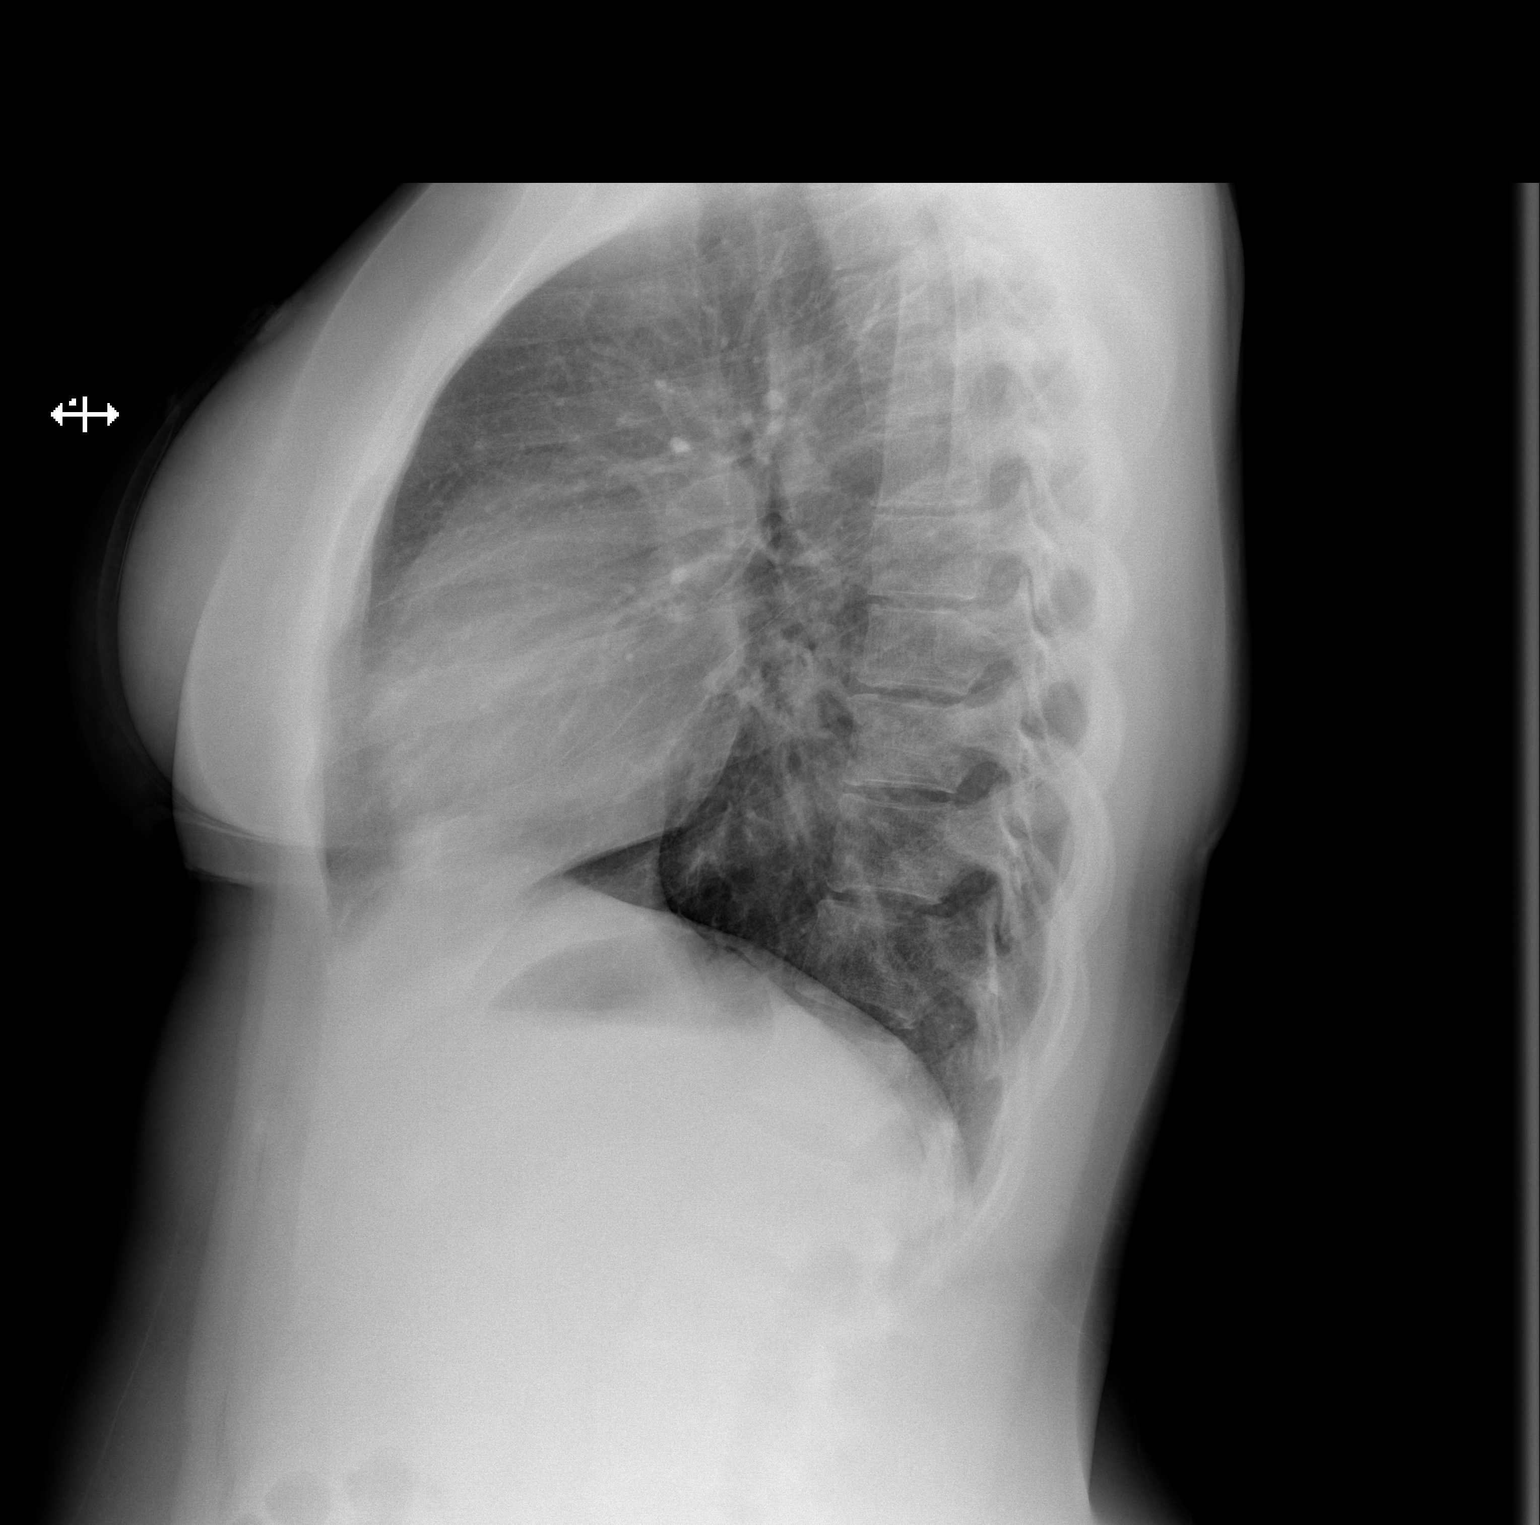

[2 of 2 positions shown; findings below may reference images not displayed]

FINDINGS: Normal heart size, mediastinal contours, and pulmonary vascularity.

Lungs clear.

No pleural effusion or pneumothorax.

Bones unremarkable.
IMPRESSION: Normal exam.
